# Patient Record
Sex: Female | Born: 1985 | Race: White | Hispanic: No | Smoking: Former smoker
Health system: Southern US, Community
[De-identification: ages and names within clinical notes are randomized; demographics above are authoritative.]

## PROBLEM LIST (undated history)

## (undated) ENCOUNTER — Inpatient Hospital Stay (HOSPITAL_COMMUNITY): Payer: Self-pay

## (undated) DIAGNOSIS — F988 Other specified behavioral and emotional disorders with onset usually occurring in childhood and adolescence: Secondary | ICD-10-CM

## (undated) DIAGNOSIS — E559 Vitamin D deficiency, unspecified: Secondary | ICD-10-CM

## (undated) DIAGNOSIS — E042 Nontoxic multinodular goiter: Secondary | ICD-10-CM

## (undated) DIAGNOSIS — E669 Obesity, unspecified: Secondary | ICD-10-CM

## (undated) DIAGNOSIS — D510 Vitamin B12 deficiency anemia due to intrinsic factor deficiency: Secondary | ICD-10-CM

## (undated) DIAGNOSIS — K219 Gastro-esophageal reflux disease without esophagitis: Secondary | ICD-10-CM

## (undated) HISTORY — DX: Gastro-esophageal reflux disease without esophagitis: K21.9

## (undated) HISTORY — PX: WISDOM TOOTH EXTRACTION: SHX21

## (undated) HISTORY — DX: Nontoxic multinodular goiter: E04.2

## (undated) HISTORY — DX: Vitamin B12 deficiency anemia due to intrinsic factor deficiency: D51.0

## (undated) HISTORY — DX: Vitamin D deficiency, unspecified: E55.9

## (undated) HISTORY — DX: Obesity, unspecified: E66.9

---
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
## 2013-02-16 ENCOUNTER — Inpatient Hospital Stay (HOSPITAL_COMMUNITY)
Admission: AD | Admit: 2013-02-16 | Discharge: 2013-02-17 | Disposition: A | Discharge status: Home / Self Care | Payer: Medicaid Other | Source: Ambulatory Visit | Attending: Obstetrics and Gynecology | Admitting: Obstetrics and Gynecology

## 2013-02-16 ENCOUNTER — Encounter (HOSPITAL_COMMUNITY): Payer: Self-pay | Admitting: *Deleted

## 2013-02-16 DIAGNOSIS — Z3490 Encounter for supervision of normal pregnancy, unspecified, unspecified trimester: Secondary | ICD-10-CM

## 2013-02-16 DIAGNOSIS — B9689 Other specified bacterial agents as the cause of diseases classified elsewhere: Secondary | ICD-10-CM

## 2013-02-16 DIAGNOSIS — O99891 Other specified diseases and conditions complicating pregnancy: Secondary | ICD-10-CM | POA: Insufficient documentation

## 2013-02-16 DIAGNOSIS — R109 Unspecified abdominal pain: Secondary | ICD-10-CM | POA: Insufficient documentation

## 2013-02-16 HISTORY — DX: Nontoxic multinodular goiter: E04.2

## 2013-02-16 LAB — POCT PREGNANCY, URINE: Preg Test, Ur: POSITIVE — AB

## 2013-02-16 NOTE — MAU Note (Signed)
Pt reports having lower left abd pain and cramping that started last night. Reports hav ing some white creamy vag discharge.

## 2013-02-17 ENCOUNTER — Inpatient Hospital Stay (HOSPITAL_COMMUNITY): Payer: Medicaid Other

## 2013-02-17 DIAGNOSIS — A499 Bacterial infection, unspecified: Secondary | ICD-10-CM

## 2013-02-17 DIAGNOSIS — N760 Acute vaginitis: Secondary | ICD-10-CM

## 2013-02-17 LAB — CBC
HCT: 35 % — ABNORMAL LOW (ref 36.0–46.0)
Hemoglobin: 12.1 g/dL (ref 12.0–15.0)
MCH: 29.9 pg (ref 26.0–34.0)
MCHC: 34.6 g/dL (ref 30.0–36.0)
MCV: 86.4 fL (ref 78.0–100.0)
Platelets: 276 10*3/uL (ref 150–400)
RBC: 4.05 MIL/uL (ref 3.87–5.11)
RDW: 12.6 % (ref 11.5–15.5)
WBC: 10.2 10*3/uL (ref 4.0–10.5)

## 2013-02-17 LAB — URINALYSIS, ROUTINE W REFLEX MICROSCOPIC
Bilirubin Urine: NEGATIVE
Glucose, UA: NEGATIVE mg/dL
Hgb urine dipstick: NEGATIVE
Ketones, ur: NEGATIVE mg/dL
Leukocytes, UA: NEGATIVE
Nitrite: NEGATIVE
Protein, ur: NEGATIVE mg/dL
Specific Gravity, Urine: 1.015 (ref 1.005–1.030)
Urobilinogen, UA: 0.2 mg/dL (ref 0.0–1.0)
pH: 6 (ref 5.0–8.0)

## 2013-02-17 LAB — WET PREP, GENITAL: Yeast Wet Prep HPF POC: NONE SEEN

## 2013-02-17 MED ORDER — METRONIDAZOLE 500 MG PO TABS: 500.0000 mg | ORAL_TABLET | Freq: Two times a day (BID) | ORAL | Status: DC

## 2013-02-17 NOTE — MAU Provider Note (Signed)
Attestation of Attending Supervision of Advanced Practitioner: Evaluation and management procedures were performed by the PA/NP/CNM/OB Fellow under my supervision/collaboration. Chart reviewed and agree with management and plan.  Pairlee Sawtell V 02/17/2013 1:17 PM

## 2013-02-17 NOTE — MAU Provider Note (Signed)
History     CSN: [id number]  Arrival date and time: 02/16/13 2152   First Provider Initiated Contact with Patient 02/17/13 0013      Chief Complaint  Patient presents with  . Abdominal Pain   HPI This is a 27 y.o. female at [redacted]w[redacted]d who presents with c/o lower abdominal cramping since yesterday. Denies bleeding. Has some discharge, no itching or odor. Has not been seen for this pregnancy yet. Thinks she might go to one of the [employer]. Mainly wants to deliver here, not sure which MD group.  RN Note: Pt reports having lower left abd pain and cramping that started last night. Reports hav ing some white creamy vag discharge.      OB History   Grav Para Term Preterm Abortions TAB SAB Ect Mult Living   3    2 2           Past Medical History  Diagnosis Date  . Multiple thyroid nodules     History reviewed. No pertinent past surgical history.  History reviewed. No pertinent family history.  History  Substance Use Topics  . Smoking status: Never Smoker   . Smokeless tobacco: Not on file  . Alcohol Use: No    Allergies:  Allergies  Allergen Reactions  . Ceclor (Cefaclor) Hives    Prescriptions prior to admission  Medication Sig Dispense Refill  . Prenatal Vit-Fe Fumarate-FA (PRENATAL MULTIVITAMIN) TABS Take 1 tablet by mouth daily at 12 noon.        Review of Systems  Constitutional: Negative for fever and malaise/fatigue.  Gastrointestinal: Positive for abdominal pain. Negative for nausea, vomiting, diarrhea and constipation.  Neurological: Negative for dizziness and headaches.   Physical Exam   Blood pressure 132/74, pulse 110, temperature 98.5 F (36.9 C), temperature source Oral, resp. rate 16, height 5\' 2"  (1.575 m), weight 82.555 kg (182 lb), last menstrual period 12/30/2012.  Physical Exam  Constitutional: She is oriented to person, place, and time. She appears well-developed and well-nourished. No distress.  HENT:  Head: Normocephalic.   Cardiovascular: Normal rate.   Respiratory: Effort normal.  GI: Soft. She exhibits no distension and no mass. There is no tenderness. There is no rebound and no guarding.  Genitourinary: Uterus normal. Vaginal discharge: thick white.  Uterus 6-8 week size Nontender Adnexae nontender  Musculoskeletal: Normal range of motion.  Neurological: She is alert and oriented to person, place, and time.  Skin: Skin is warm and dry.  Psychiatric: She has a normal mood and affect.    MAU Course  Procedures  MDM US Ob Transvaginal  02/17/2013   *RADIOLOGY REPORT*  Clinical Data: Left lower quadrant pain.  Quantitative beta HCG pending.  OBSTETRIC <14 WK ULTRASOUND  Technique:  Transabdominal ultrasound was performed for evaluation of the gestation as well as the maternal uterus and adnexal regions.  Comparison:  None.  Intrauterine gestational sac: Visualized/normal in shape. Yolk sac: Present Embryo: Present Cardiac Activity: Present Heart Rate: 125 bpm  CRL:  8.8 mm  7 w  0 d        [place] EDC: 10/06/2012  Maternal uterus/Adnexae: Right ovary is not visualized.  There is no subchorionic hemorrhage.  There is a normal physiologic appearance of the left ovary.  No free fluid.  IMPRESSION: Uncomplicated single intrauterine pregnancy.   Original Report Authenticated By: Andreas Newport, M.D.  Results for orders placed during the hospital encounter of 02/16/13 (from the past 24 hour(s))  URINALYSIS, ROUTINE W REFLEX MICROSCOPIC  Status: None   Collection Time    02/16/13 10:13 PM      Result Value Range   Color, Urine YELLOW  YELLOW   APPearance CLEAR  CLEAR   Specific Gravity, Urine 1.015  1.005 - 1.030   pH 6.0  5.0 - 8.0   Glucose, UA NEGATIVE  NEGATIVE mg/dL   Hgb urine dipstick NEGATIVE  NEGATIVE   Bilirubin Urine NEGATIVE  NEGATIVE   Ketones, ur NEGATIVE  NEGATIVE mg/dL   Protein, ur NEGATIVE  NEGATIVE mg/dL   Urobilinogen, UA 0.2  0.0 - 1.0 mg/dL   Nitrite NEGATIVE  NEGATIVE   Leukocytes, UA  NEGATIVE  NEGATIVE  POCT PREGNANCY, URINE     Status: Abnormal   Collection Time    02/16/13 11:08 PM      Result Value Range   Preg Test, Ur POSITIVE (*) NEGATIVE  WET PREP, GENITAL     Status: Abnormal   Collection Time    02/17/13 12:15 AM      Result Value Range   Yeast Wet Prep HPF POC NONE SEEN  NONE SEEN   Trich, Wet Prep NONE SEEN  NONE SEEN   Clue Cells Wet Prep HPF POC FEW (*) NONE SEEN   WBC, Wet Prep HPF POC FEW (*) NONE SEEN  HCG, QUANTITATIVE, PREGNANCY     Status: Abnormal   Collection Time    02/17/13 12:40 AM      Result Value Range   hCG, Beta Chain, Quant, S [id number] (*) <5 mIU/mL  CBC     Status: Abnormal   Collection Time    02/17/13 12:40 AM      Result Value Range   WBC 10.2  4.0 - 10.5 K/uL   RBC 4.05  3.87 - 5.11 MIL/uL   Hemoglobin 12.1  12.0 - 15.0 g/dL   HCT 56.2 (*) 13.0 - 86.5 %   MCV 86.4  78.0 - 100.0 fL   MCH 29.9  26.0 - 34.0 pg   MCHC 34.6  30.0 - 36.0 g/dL   RDW 78.4  69.6 - 29.5 %   Platelets 276  150 - 400 K/uL     Assessment and Plan  A:  [facility] at [redacted]w[redacted]d       Reassuring fetal ultrasound  P:  Discharge home       Come back if worsens or has bleeding       Start prenatal care  [facility] 02/17/2013, 1:23 AM

## 2013-02-18 LAB — GC/CHLAMYDIA PROBE AMP
CT Probe RNA: POSITIVE — AB
GC Probe RNA: NEGATIVE

## 2013-03-01 ENCOUNTER — Telehealth: Payer: Self-pay

## 2013-03-01 NOTE — Telephone Encounter (Signed)
Pt called and stated that she had came to ER for a miscarriage but could not understand the rest of message.   Called pt and pt stated that her issue was addressed.  Pt did not have any other questions.

## 2013-12-22 ENCOUNTER — Encounter (HOSPITAL_COMMUNITY): Payer: Self-pay | Admitting: *Deleted

## 2014-05-22 ENCOUNTER — Encounter (HOSPITAL_COMMUNITY): Payer: Self-pay | Admitting: *Deleted

## 2016-04-21 ENCOUNTER — Emergency Department (HOSPITAL_BASED_OUTPATIENT_CLINIC_OR_DEPARTMENT_OTHER)
Admission: EM | Admit: 2016-04-21 | Discharge: 2016-04-21 | Disposition: A | Discharge status: Home / Self Care | Payer: BLUE CROSS/BLUE SHIELD | Attending: Emergency Medicine | Admitting: Emergency Medicine

## 2016-04-21 ENCOUNTER — Encounter (HOSPITAL_BASED_OUTPATIENT_CLINIC_OR_DEPARTMENT_OTHER): Payer: Self-pay

## 2016-04-21 DIAGNOSIS — M542 Cervicalgia: Secondary | ICD-10-CM

## 2016-04-21 DIAGNOSIS — F17200 Nicotine dependence, unspecified, uncomplicated: Secondary | ICD-10-CM | POA: Insufficient documentation

## 2016-04-21 DIAGNOSIS — F909 Attention-deficit hyperactivity disorder, unspecified type: Secondary | ICD-10-CM | POA: Insufficient documentation

## 2016-04-21 DIAGNOSIS — R590 Localized enlarged lymph nodes: Secondary | ICD-10-CM | POA: Diagnosis not present

## 2016-04-21 HISTORY — DX: Other specified behavioral and emotional disorders with onset usually occurring in childhood and adolescence: F98.8

## 2016-04-21 MED ORDER — IBUPROFEN 800 MG PO TABS: 800.0000 mg | ORAL_TABLET | Freq: Three times a day (TID) | ORAL | 0 refills | Status: AC

## 2016-04-21 NOTE — ED Triage Notes (Signed)
Dx with thyroid nodules in 2011-worse since July 2017-today area is more painful to right side of neck-NAD-steady gait

## 2016-04-21 NOTE — Discharge Instructions (Signed)
Please follow up with your primary care provider for further evaluation of your neck pain and your thyroid nodule.  Take ibuprofen as needed for pain.  Return to the ER if you have difficulty swallowing, breathing, having fever, worsening chest pain or if you have other concerns.

## 2016-04-21 NOTE — ED Provider Notes (Signed)
[facility] Provider Note   CSN: [id number] Arrival date & time: 04/21/16  1938  By signing my name below, I, Phillis Haggis, attest that this documentation has been prepared under the direction and in the presence of Fayrene Helper, PA-C. Electronically Signed: Phillis Haggis, ED Scribe. 04/21/16. 10:27 PM.  History   Chief Complaint Chief Complaint  Patient presents with  . Thyroid Nodule   The history is provided by the patient. No language interpreter was used.   HPI Comments: Vanessa Campbell is a 30 y.o. Female with a hx of multiple thyroid nodules who presents to the Emergency Department complaining of worsening, recurrent thyroid nodules onset 3 months ago, worsening today. Pt reports worsening, sharp pain to the right side of her neck that radiates to her chest and right ear. There is associated swelling. This pain is worsened with rotation of the neck, palpation, and swallowing. She notes increased heart palpitations and intermittent headache. Pt is being followed by her PCP for the three nodules present. She says that she had a biopsy of a concerning nodule in July that was benign. Pt has an ultrasound performed yearly for the nodules. She denies fever, chills, diaphoresis, sneezing, coughing, trouble swallowing, voice change, SOB, chest pain, abdominal pain, nausea, vomiting, heat intolerance that is new, or speech difficulty. Pt would like to be referred to a specialist for further evaluation. Pt's PCP is Dr. Juanetta Gosling in [place].   Past Medical History:  Diagnosis Date  . ADD (attention deficit disorder)   . Multiple thyroid nodules    There are no active problems to display for this patient.  Past Surgical History:  Procedure Laterality Date  . WISDOM TOOTH EXTRACTION     OB History    Gravida Para Term Preterm AB Living   3       2     SAB TAB Ectopic Multiple Live Births     2           Home Medications    Prior to Admission medications   Medication  Sig Start Date End Date Taking? Authorizing Provider  ALPRAZolam Prudy Feeler) 0.25 MG tablet Take 0.25 mg by mouth at bedtime as needed for anxiety.   Yes Historical Provider, MD  lisdexamfetamine (VYVANSE) 40 MG capsule Take 40 mg by mouth every morning.   Yes Historical Provider, MD    Family History No family history on file.  Social History Social History  Substance Use Topics  . Smoking status: Current Every Day Smoker  . Smokeless tobacco: Never Used  . Alcohol use Yes     Comment: occ     Allergies   Ceclor [cefaclor]  Review of Systems Review of Systems  Constitutional: Negative for chills and fever.  HENT: Negative for sneezing, trouble swallowing and voice change.   Respiratory: Negative for cough and shortness of breath.   Cardiovascular: Positive for palpitations. Negative for chest pain.  Gastrointestinal: Negative for abdominal pain, nausea and vomiting.  Endocrine: Negative for heat intolerance.  Musculoskeletal: Positive for neck pain.  Neurological: Positive for headaches. Negative for speech difficulty.  All other systems reviewed and are negative.  Physical Exam Updated Vital Signs BP 133/100 (BP Location: Right Arm)   Pulse 92   Temp 98.8 F (37.1 C) (Oral)   Resp 20   Ht 5\' 2"  (1.575 m)   Wt 174 lb (78.9 kg)   LMP 04/16/2016   SpO2 100%   BMI 31.83 kg/m   Physical Exam  Constitutional: She  is oriented to person, place, and time. She appears well-developed and well-nourished.  HENT:  Head: Normocephalic and atraumatic.  Eyes: EOM are normal. Pupils are equal, round, and reactive to light.  Neck: Trachea normal and normal range of motion. Neck supple. No tracheal deviation present. No thyromegaly present.  Tenderness to palpation noted to the right sternocleidomastoid muscle without any surrounding skin changes; trachea midline; there is a reactive lymph node to the right supraclavicular region that is tender to palpation  Cardiovascular: Normal  rate, regular rhythm and normal heart sounds.  Exam reveals no gallop and no friction rub.   No murmur heard. Pulmonary/Chest: Effort normal and breath sounds normal. She has no wheezes.  Abdominal: Soft. There is no tenderness.  Musculoskeletal: Normal range of motion.  Lymphadenopathy:    She has no cervical adenopathy.  Neurological: She is alert and oriented to person, place, and time.  Skin: Skin is warm and dry.  Psychiatric: She has a normal mood and affect. Her behavior is normal.  Nursing note and vitals reviewed.  ED Treatments / Results  DIAGNOSTIC STUDIES: Oxygen Saturation is 99% on RA, normal by my interpretation.    COORDINATION OF CARE: 10:25 PM-Discussed treatment plan which includes follow up with PCP with pt at bedside and pt agreed to plan.    Labs (all labs ordered are listed, but only abnormal results are displayed) Labs Reviewed - No data to display  EKG  EKG Interpretation None       Radiology No results found.  Procedures Procedures (including critical care time)  Medications Ordered in ED Medications - No data to display   Initial Impression / Assessment and Plan / ED Course  I have reviewed the triage vital signs and the nursing notes.  Pertinent labs & imaging results that were available during my care of the patient were reviewed by me and considered in my medical decision making (see chart for details).  Clinical Course    BP 133/100 (BP Location: Right Arm)   Pulse 92   Temp 98.8 F (37.1 C) (Oral)   Resp 20   Ht 5\' 2"  (1.575 m)   Wt 78.9 kg   LMP 04/16/2016   SpO2 100%   BMI 31.83 kg/m    Final Clinical Impressions(s) / ED Diagnoses   Final diagnoses:  Supraclavicular lymphadenopathy  Neck pain on right side   I personally performed the services described in this documentation, which was scribed in my presence. The recorded information has been reviewed and is accurate.     New Prescriptions New Prescriptions    IBUPROFEN (ADVIL,MOTRIN) 800 MG TABLET    Take 1 tablet (800 mg total) by mouth 3 (three) times daily.   10:48 PM Pt here with R sided neck pain.  She does have tenderness to her SCM and also have a reactive lymph node to the R supraclavicular region.  Thyroid appears normal.  No surrounding skin changes to suggest cellulitis.  She has no airway compromise.  No significant risk factor for breast CA.  VSS.  At this time, I recommend watchful waiting, to take NSAIDs as needed for pain and to f/u with her PCP for further care.  Pt encourage to return to the ER if she develops fever, difficulty breathing, chest pain, or worsening of her condition.  Pt stable for discharge. Doubt thyrotoxicosis, thyroid storm, myxedema coma or other acute emergent condition.     Fayrene HelperBowie Pietro Bonura, PA-C 04/21/16 [id number]    Rolan BuccoMelanie Belfi, MD 04/21/16  2318  

## 2016-04-21 NOTE — ED Notes (Signed)
Urine sample taken to lab. No orders at this time.

## 2016-04-24 ENCOUNTER — Emergency Department (HOSPITAL_COMMUNITY): Payer: BLUE CROSS/BLUE SHIELD

## 2016-04-24 ENCOUNTER — Emergency Department (HOSPITAL_COMMUNITY)
Admission: EM | Admit: 2016-04-24 | Discharge: 2016-04-24 | Disposition: A | Discharge status: Home / Self Care | Payer: BLUE CROSS/BLUE SHIELD | Attending: Emergency Medicine | Admitting: Emergency Medicine

## 2016-04-24 DIAGNOSIS — F17200 Nicotine dependence, unspecified, uncomplicated: Secondary | ICD-10-CM | POA: Diagnosis not present

## 2016-04-24 DIAGNOSIS — M542 Cervicalgia: Secondary | ICD-10-CM

## 2016-04-24 DIAGNOSIS — E041 Nontoxic single thyroid nodule: Secondary | ICD-10-CM | POA: Diagnosis not present

## 2016-04-24 DIAGNOSIS — R079 Chest pain, unspecified: Secondary | ICD-10-CM | POA: Diagnosis present

## 2016-04-24 DIAGNOSIS — F909 Attention-deficit hyperactivity disorder, unspecified type: Secondary | ICD-10-CM | POA: Insufficient documentation

## 2016-04-24 LAB — COMPREHENSIVE METABOLIC PANEL
ALT: 21 U/L (ref 14–54)
AST: 19 U/L (ref 15–41)
Albumin: 4.7 g/dL (ref 3.5–5.0)
Alkaline Phosphatase: 61 U/L (ref 38–126)
Anion gap: 9 (ref 5–15)
BUN: 6 mg/dL (ref 6–20)
CO2: 27 mmol/L (ref 22–32)
Calcium: 9.9 mg/dL (ref 8.9–10.3)
Chloride: 103 mmol/L (ref 101–111)
Creatinine, Ser: 0.68 mg/dL (ref 0.44–1.00)
GFR calc Af Amer: >60 mL/min (ref >60)
GFR calc non Af Amer: >60 mL/min (ref >60)
Glucose, Bld: 92 mg/dL (ref 65–99)
Potassium: 3.8 mmol/L (ref 3.5–5.1)
Sodium: 139 mmol/L (ref 135–145)
Total Bilirubin: 0.2 mg/dL — ABNORMAL LOW (ref 0.3–1.2)
Total Protein: 7.3 g/dL (ref 6.5–8.1)

## 2016-04-24 LAB — CBC WITH DIFFERENTIAL/PLATELET
Basophils Absolute: 0 10*3/uL (ref 0.0–0.1)
Basophils Relative: 0 %
Eosinophils Absolute: 0 10*3/uL (ref 0.0–0.7)
Eosinophils Relative: 0 %
HCT: 41.8 % (ref 36.0–46.0)
Hemoglobin: 14.2 g/dL (ref 12.0–15.0)
Lymphocytes Relative: 29 %
Lymphs Abs: 2.3 10*3/uL (ref 0.7–4.0)
MCH: 30.7 pg (ref 26.0–34.0)
MCHC: 34 g/dL (ref 30.0–36.0)
MCV: 90.3 fL (ref 78.0–100.0)
Monocytes Absolute: 0.3 10*3/uL (ref 0.1–1.0)
Monocytes Relative: 4 %
Neutro Abs: 5.1 10*3/uL (ref 1.7–7.7)
Neutrophils Relative %: 67 %
Platelets: 334 10*3/uL (ref 150–400)
RBC: 4.63 MIL/uL (ref 3.87–5.11)
RDW: 11.9 % (ref 11.5–15.5)
WBC: 7.8 10*3/uL (ref 4.0–10.5)

## 2016-04-24 LAB — I-STAT BETA HCG BLOOD, ED (MC, WL, AP ONLY)

## 2016-04-24 LAB — I-STAT TROPONIN, ED: Troponin i, poc: 0 ng/mL (ref 0.00–0.08)

## 2016-04-24 MED ORDER — ORPHENADRINE CITRATE ER 100 MG PO TB12: 100.0000 mg | ORAL_TABLET | Freq: Two times a day (BID) | ORAL | 0 refills | Status: DC

## 2016-04-24 MED ORDER — IOPAMIDOL (ISOVUE-300) INJECTION 61%
INTRAVENOUS | Status: AC
  Administered 2016-04-24: 75 mL
  Filled 2016-04-24: qty 75

## 2016-04-24 NOTE — ED Triage Notes (Addendum)
Pt reports centralized chest pain since yesterday that radiates to right neck, right shoulder and into right ear. Pt also reports also a sharp pain in the back of her throat. Pt reports she does not think she got anything stuck in her throat. Pt has hx of nodules on her thyroid. Pt has had the throat and neck pain since Monday.

## 2016-04-24 NOTE — ED Provider Notes (Signed)
[facility]PT Provider Note   CSN: [id number] Arrival date & time: 04/24/16  1636     History   Chief Complaint Chief Complaint  Patient presents with  . Chest Pain  . Cough  . Thyroid Problem    HPI Vanessa Campbell is a 30 y.o. female.  HPI Patient reports that she has a tender area on the right side of her neck. She reports that the swelling has increased. She reports that now she is having pain which she perceives deep and toward the back of her throat. She reports sometimes the pain is sharp and radiates both up towards her ear as well as down towards her collarbone. She is also perceiving some aching at the base of her neck anteriorly. He has not developed fever or chills. She does not have nausea or vomiting. Patient was seen 3 days ago but reports his symptoms are worsening. Past Medical History:  Diagnosis Date  . ADD (attention deficit disorder)   . Multiple thyroid nodules     There are no active problems to display for this patient.   Past Surgical History:  Procedure Laterality Date  . WISDOM TOOTH EXTRACTION      OB History    Gravida Para Term Preterm AB Living   3       2     SAB TAB Ectopic Multiple Live Births     2             Home Medications    Prior to Admission medications   Medication Sig Start Date End Date Taking? Authorizing Provider  ALPRAZolam Prudy Feeler) 0.25 MG tablet Take 0.25 mg by mouth at bedtime as needed for anxiety.   Yes Historical Provider, MD  ibuprofen (ADVIL,MOTRIN) 800 MG tablet Take 1 tablet (800 mg total) by mouth 3 (three) times daily. 04/21/16  Yes Fayrene Helper, PA-C  lisdexamfetamine (VYVANSE) 40 MG capsule Take 40 mg by mouth every morning.   Yes Historical Provider, MD  orphenadrine (NORFLEX) 100 MG tablet Take 1 tablet (100 mg total) by mouth 2 (two) times daily. 04/24/16   Arby Barrette, MD    Family History No family history on file.  Social History Social History  Substance Use Topics  . Smoking status:  Current Every Day Smoker  . Smokeless tobacco: Never Used  . Alcohol use Yes     Comment: occ     Allergies   Ceclor [cefaclor]   Review of Systems Review of Systems 10 Systems reviewed and are negative for acute change except as noted in the HPI.   Physical Exam Updated Vital Signs BP 124/87 (BP Location: Right Arm)   Pulse 80   Resp 12   LMP 04/16/2016   SpO2 99%   Physical Exam  Constitutional: She is oriented to person, place, and time. She appears well-developed and well-nourished. No distress.  HENT:  Head: Normocephalic and atraumatic.  Right Ear: External ear normal.  Left Ear: External ear normal.  Nose: Nose normal.  Mouth/Throat: Oropharynx is clear and moist.  Bilateral TMs normal. No facial swelling or asymmetry. Dentition is in excellent condition. Posterior oropharynx is widely patent. No tonsillar erythema, swelling or exudate. Visualization underneath the tongue shows mucous membranes to be in normal condition without swellings or masses. Palpation of the neck shows the patient to be tender in the right lateral neck in the area of the anterior margin of the SCM and at the base of the SCM along the top of collarbone. Possible  mild swelling in this area relative to the left. Cannot isolate distinct mass. Patient does not have meningismus. No stridor. No skin changes.  Eyes: Conjunctivae and EOM are normal. Pupils are equal, round, and reactive to light.  Neck: Neck supple. No JVD present. No tracheal deviation present. No thyromegaly present.  Cardiovascular: Normal rate and regular rhythm.   No murmur heard. Pulmonary/Chest: Effort normal and breath sounds normal. No stridor. No respiratory distress.  Abdominal: Soft. There is no tenderness.  Musculoskeletal: She exhibits no edema, tenderness or deformity.  Lymphadenopathy:    She has no cervical adenopathy.  Neurological: She is alert and oriented to person, place, and time. No cranial nerve deficit. She  exhibits normal muscle tone. Coordination normal.  Skin: Skin is warm and dry. No pallor.  Psychiatric: She has a normal mood and affect.  Nursing note and vitals reviewed.    ED Treatments / Results  Labs (all labs ordered are listed, but only abnormal results are displayed) Labs Reviewed  COMPREHENSIVE METABOLIC PANEL - Abnormal; Notable for the following:       Result Value   Total Bilirubin 0.2 (*)    All other components within normal limits  CBC WITH DIFFERENTIAL/PLATELET  I-STAT TROPOININ, ED  I-STAT BETA HCG BLOOD, ED ([facility], [facility], [facility] ONLY)    EKG  EKG Interpretation  Date/Time:  Thursday April 24 2016 16:47:10 EDT Ventricular Rate:  92 PR Interval:  156 QRS Duration: 82 QT Interval:  342 QTC Calculation: 422 R Axis:   59 Text Interpretation:  Normal sinus rhythm Normal ECG agree. no old comparison. Confirmed by Donnald Garre, MD, Lebron Conners [phone]) on 04/24/2016 5:20:26 PM Also confirmed by Donnald Garre, MD, Lebron Conners [phone]), editor [place]  CCT, [place] ([id number])  on 04/25/2016 8:48:56 AM       Radiology Dg Chest 2 View  Result Date: 04/24/2016 CLINICAL DATA:  Chest pain, pain in throat with burning, shortness of breath and dry cough, onset of symptoms yesterday, history smoking EXAM: CHEST  2 VIEW COMPARISON:  None FINDINGS: Normal heart size, mediastinal contours, and pulmonary vascularity. Lungs clear. No pleural effusion or pneumothorax. Bones unremarkable. IMPRESSION: Normal exam. Electronically Signed   By: Ulyses Southward M.D.   On: 04/24/2016 17:18   Ct Soft Tissue Neck W Contrast  Result Date: 04/24/2016 CLINICAL DATA:  30 year old female with right neck pain and swelling. Query abscess or mass. Initial encounter. Personal history of thyroid nodules EXAM: CT NECK WITH CONTRAST TECHNIQUE: Multidetector CT imaging of the neck was performed using the standard protocol following the bolus administration of intravenous contrast. CONTRAST:  75mL ISOVUE-300 IOPAMIDOL (ISOVUE-300)  INJECTION 61% COMPARISON:  None. FINDINGS: Pharynx and larynx: Negative larynx. Pharynx soft tissue contours are within normal limits. Negative parapharyngeal and retropharyngeal spaces. Salivary glands: Negative sublingual space. Negative submandibular glands. Negative parotid glands. Thyroid: Partially cystic partially solid 17 mm right thyroid nodule. Otherwise negative. Lymph nodes: No cervical lymphadenopathy. The largest nodes are at the bilateral level 2 stations measuring up to 8-9 mm short axis, within normal limits. No calcified, cystic or necrotic nodes. No areas of inflammatory stranding identified in the neck. Vascular: Major vascular structures in the neck and at the skullbase are patent, including the right IJ. Limited intracranial: Negative. Visualized orbits: Not included. Mastoids and visualized paranasal sinuses: Visible paranasal sinuses and mastoids are clear. Skeleton: No osseous abnormality identified. No definite dental abnormality. Upper chest: Normal visualized superior mediastinum, small residual thymus. Negative lung apices; minimal subpleural scarring or atelectasis on the right.  No superior mediastinal or visible axillary lymphadenopathy. Other: None. IMPRESSION: Negative neck CT aside from a 17 mm right thyroid nodule which meets consensus criteria for characterization with non emergent Thyroid Ultrasound if not previously performed. No acute or inflammatory process identified. Electronically Signed   By: Odessa FlemingH  Hall M.D.   On: 04/24/2016 19:26    Procedures Procedures (including critical care time)  Medications Ordered in ED Medications  iopamidol (ISOVUE-300) 61 % injection (75 mLs  Contrast Given 04/24/16 1859)     Initial Impression / Assessment and Plan / ED Course  I have reviewed the triage vital signs and the nursing notes.  Pertinent labs & imaging results that were available during my care of the patient were reviewed by me and considered in my medical decision  making (see chart for details).  Clinical Course    Final Clinical Impressions(s) / ED Diagnoses   Final diagnoses:  Nonspecific pain in the neck region  Thyroid nodule   Patient is alert and nontoxic. Vital signs are stable. Airway is widely patent. There is no stridor or external evidence of meningismus or trismus. CT shows no vascular, mass or likely infectious etiology. At this time until patient stable for ongoing outpatient management with follow-up with her PCP as well as ENT for further assessment. Consideration of this time is for musculoskeletal pain along the course of the SCM. Patient is advised to continue her ibuprofen as prescribed from prior visit and initiate Norflex. Also she is advised to take Pepcid twice daily for possible reflux as etiology until she can complete outpatient evaluation.  New Prescriptions Discharge Medication List as of 04/24/2016  7:53 PM    START taking these medications   Details  orphenadrine (NORFLEX) 100 MG tablet Take 1 tablet (100 mg total) by mouth 2 (two) times daily., Starting Thu 04/24/2016, Print         Arby BarretteMarcy Jadalee Westcott, MD 04/25/16 [phone]

## 2016-04-24 NOTE — ED Notes (Signed)
Patient went to xray 

## 2016-05-08 ENCOUNTER — Ambulatory Visit (INDEPENDENT_AMBULATORY_CARE_PROVIDER_SITE_OTHER): Payer: BLUE CROSS/BLUE SHIELD | Admitting: Endocrinology

## 2016-05-08 ENCOUNTER — Encounter: Payer: Self-pay | Admitting: Endocrinology

## 2016-05-08 DIAGNOSIS — F988 Other specified behavioral and emotional disorders with onset usually occurring in childhood and adolescence: Secondary | ICD-10-CM | POA: Diagnosis not present

## 2016-05-08 DIAGNOSIS — E042 Nontoxic multinodular goiter: Secondary | ICD-10-CM | POA: Diagnosis not present

## 2016-05-08 NOTE — Progress Notes (Signed)
Subjective:    Patient ID: Vanessa Campbell, female    DOB: 10/10/1985, 30 y.o.   MRN: [id number]  HPI In 2009, pt was noted to have slight swelling at the right anterior neck, but no assoc pain.  She had bx then, and has been followed with serial [place] since then.  she has no h/o XRT or surgery to the neck.   Past Medical History:  Diagnosis Date  . ADD (attention deficit disorder)   . Multinodular goiter   . Multiple thyroid nodules     Past Surgical History:  Procedure Laterality Date  . WISDOM TOOTH EXTRACTION      Social History   Social History  . Marital status: Divorced    Spouse name: N/A  . Number of children: N/A  . Years of education: N/A   Occupational History  . Not on file.   Social History Main Topics  . Smoking status: Current Every Day Smoker  . Smokeless tobacco: Never Used  . Alcohol use Yes     Comment: occ  . Drug use: No  . Sexual activity: Yes    Birth control/ protection: Implant   Other Topics Concern  . Not on file   Social History Narrative  . No narrative on file    Current Outpatient Prescriptions on File Prior to Visit  Medication Sig Dispense Refill  . ALPRAZolam (XANAX) 0.25 MG tablet Take 0.25 mg by mouth at bedtime as needed for anxiety.    Marland Kitchen ibuprofen (ADVIL,MOTRIN) 800 MG tablet Take 1 tablet (800 mg total) by mouth 3 (three) times daily. 21 tablet 0  . lisdexamfetamine (VYVANSE) 40 MG capsule Take 40 mg by mouth every morning.    . orphenadrine (NORFLEX) 100 MG tablet Take 1 tablet (100 mg total) by mouth 2 (two) times daily. 30 tablet 0   No current facility-administered medications on file prior to visit.     Allergies  Allergen Reactions  . Ceclor [Cefaclor] Hives    Family History  Problem Relation Age of Onset  . Thyroid disease Neg Hx     BP 110/80 (BP Location: Right Arm, Patient Position: Sitting, Cuff Size: Normal)   Pulse 69   Temp (!) 95.8 F (35.4 C) (Oral)   Ht 5\' 2"  (1.575 m)   Wt 180 lb (81.6 kg)    LMP 04/16/2016   SpO2 98%   BMI 32.92 kg/m   Review of Systems Denies hoarseness, neck pain, diplopia, chest pain, sob, cough, dysphagia, skin rash, depression, cold intolerance, numbness, and rhinorrhea.  She has weight gain, intermittent headache, and easy bruising.     Objective:   Physical Exam VS: see vs page GEN: no distress HEAD: head: no deformity eyes: no periorbital swelling, no proptosis external nose and ears are normal mouth: no lesion seen NECK: slight swelling at the right thyroid lobe area, but I can't tell details CHEST WALL: no deformity LUNGS: clear to auscultation CV: reg rate and rhythm, no murmur ABD: abdomen is soft, nontender.  no hepatosplenomegaly.  not distended.  no hernia MUSCULOSKELETAL: muscle bulk and strength are grossly normal.  no obvious joint swelling.  gait is normal and steady EXTEMITIES: no deformity.  no ulcer on the feet.  feet are of normal color and temp.  no edema PULSES: dorsalis pedis intact bilat.  no carotid bruit NEURO:  cn 2-12 grossly intact.   readily moves all 4's.  sensation is intact to touch on the feet SKIN:  Normal texture and temperature.  No rash or suspicious lesion is visible.   NODES:  None palpable at the neck PSYCH: alert, well-oriented.  Does not appear anxious nor depressed.    outside test results are reviewed: TSH=1.06 TPO and TG antibodies are both neg  US (2017) slight enlargement of largest nodule since 2016, with central Ca++  nuc med scan (2012): uptake at the right inf thyroid field  Neck CT: several right thyroid nodules.    I have reviewed outside records, and summarized: Pt was recently seen for anxiety and other symptoms. W/u was neg    Assessment & Plan:  Multinodular goiter, with persistent growth and worrisome Ca++ Weight gain, and other sxs, not thyroid-related.  Pt signs release of info for bx cytol reports from [facility].  Patient is advised the following: Patient  Instructions  Please see a surgery specialist.  you will receive a phone call, about a day and time for an appointment. Please come back here 1-2 weeks after you get out of the hospital.

## 2016-05-08 NOTE — Progress Notes (Signed)
Pre visit review using our clinic review tool, if applicable. No additional management support is needed unless otherwise documented below in the visit note. 

## 2016-05-08 NOTE — Patient Instructions (Signed)
Please see a surgery specialist.  you will receive a phone call, about a day and time for an appointment. Please come back here 1-2 weeks after you get out of the hospital.

## 2016-05-09 ENCOUNTER — Encounter: Payer: Self-pay | Admitting: Endocrinology

## 2016-05-09 DIAGNOSIS — F988 Other specified behavioral and emotional disorders with onset usually occurring in childhood and adolescence: Secondary | ICD-10-CM | POA: Insufficient documentation

## 2016-05-22 ENCOUNTER — Other Ambulatory Visit: Payer: Self-pay | Admitting: Surgery

## 2016-05-22 DIAGNOSIS — E041 Nontoxic single thyroid nodule: Secondary | ICD-10-CM

## 2016-06-02 ENCOUNTER — Other Ambulatory Visit: Payer: BLUE CROSS/BLUE SHIELD

## 2016-06-03 ENCOUNTER — Ambulatory Visit
Admission: RE | Admit: 2016-06-03 | Discharge: 2016-06-03 | Disposition: A | Discharge status: Home / Self Care | Payer: BLUE CROSS/BLUE SHIELD | Source: Ambulatory Visit | Attending: Surgery | Admitting: Surgery

## 2016-06-03 DIAGNOSIS — E041 Nontoxic single thyroid nodule: Secondary | ICD-10-CM

## 2017-05-08 ENCOUNTER — Other Ambulatory Visit: Payer: Self-pay | Admitting: Surgery

## 2017-05-08 DIAGNOSIS — E041 Nontoxic single thyroid nodule: Secondary | ICD-10-CM

## 2017-05-21 ENCOUNTER — Ambulatory Visit
Admission: RE | Admit: 2017-05-21 | Discharge: 2017-05-21 | Disposition: A | Discharge status: Home / Self Care | Payer: BLUE CROSS/BLUE SHIELD | Source: Ambulatory Visit | Attending: Surgery | Admitting: Surgery

## 2017-05-21 ENCOUNTER — Other Ambulatory Visit (HOSPITAL_COMMUNITY)
Admission: RE | Admit: 2017-05-21 | Discharge: 2017-05-21 | Disposition: A | Discharge status: Home / Self Care | Payer: BLUE CROSS/BLUE SHIELD | Source: Ambulatory Visit | Attending: Radiology | Admitting: Radiology

## 2017-05-21 DIAGNOSIS — E041 Nontoxic single thyroid nodule: Secondary | ICD-10-CM

## 2018-05-14 ENCOUNTER — Other Ambulatory Visit (HOSPITAL_COMMUNITY): Payer: Self-pay | Admitting: Surgery

## 2018-05-14 DIAGNOSIS — E041 Nontoxic single thyroid nodule: Secondary | ICD-10-CM

## 2018-07-24 IMAGING — CR DG CHEST 2V
2 series · 2 of 2 positions shown · non-contrast
Comparison: None

CLINICAL DATA: Chest pain, pain in throat with burning, shortness
of breath and dry cough, onset of symptoms yesterday, history
smoking

EXAM:
CHEST  2 VIEW

[chest pa]
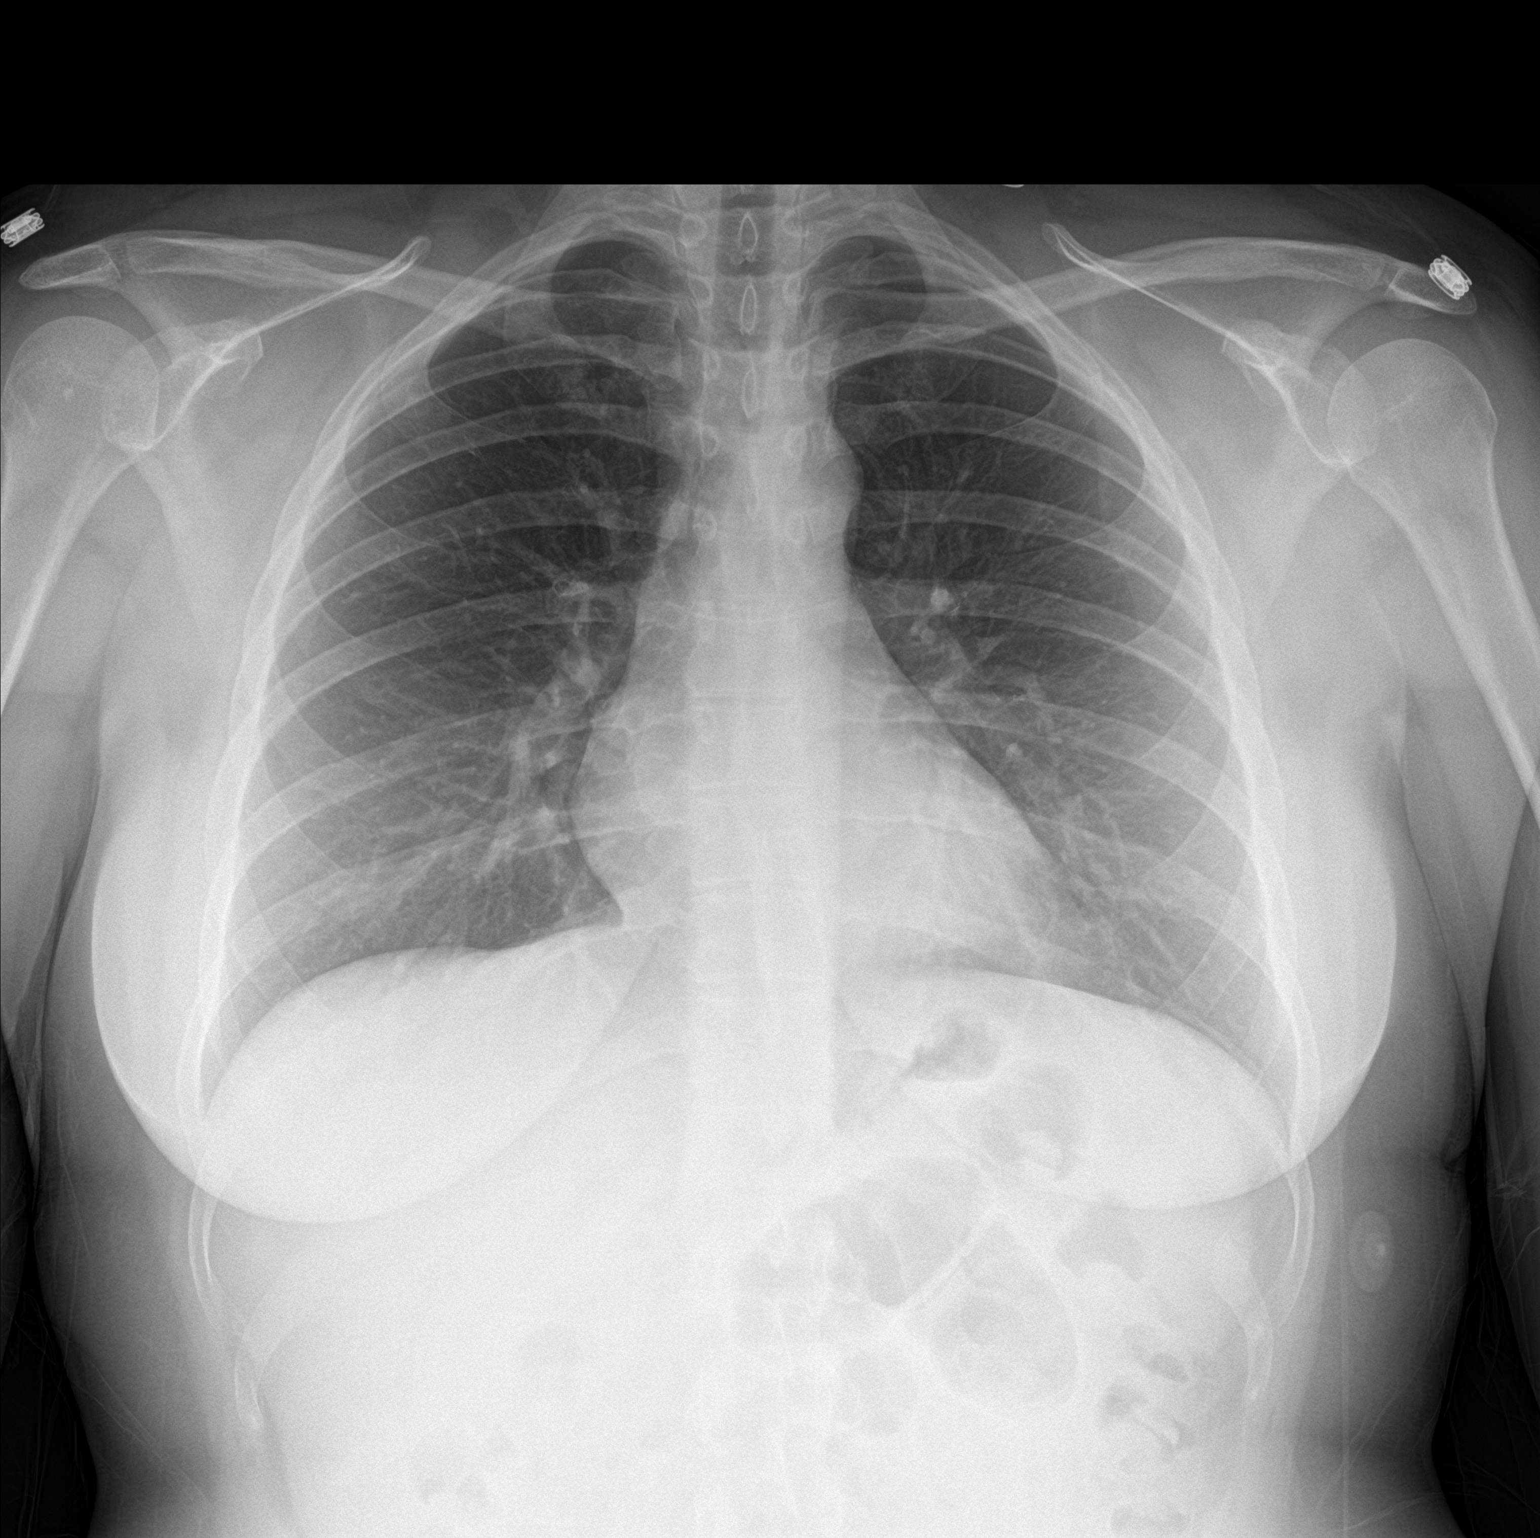

[chest lat]
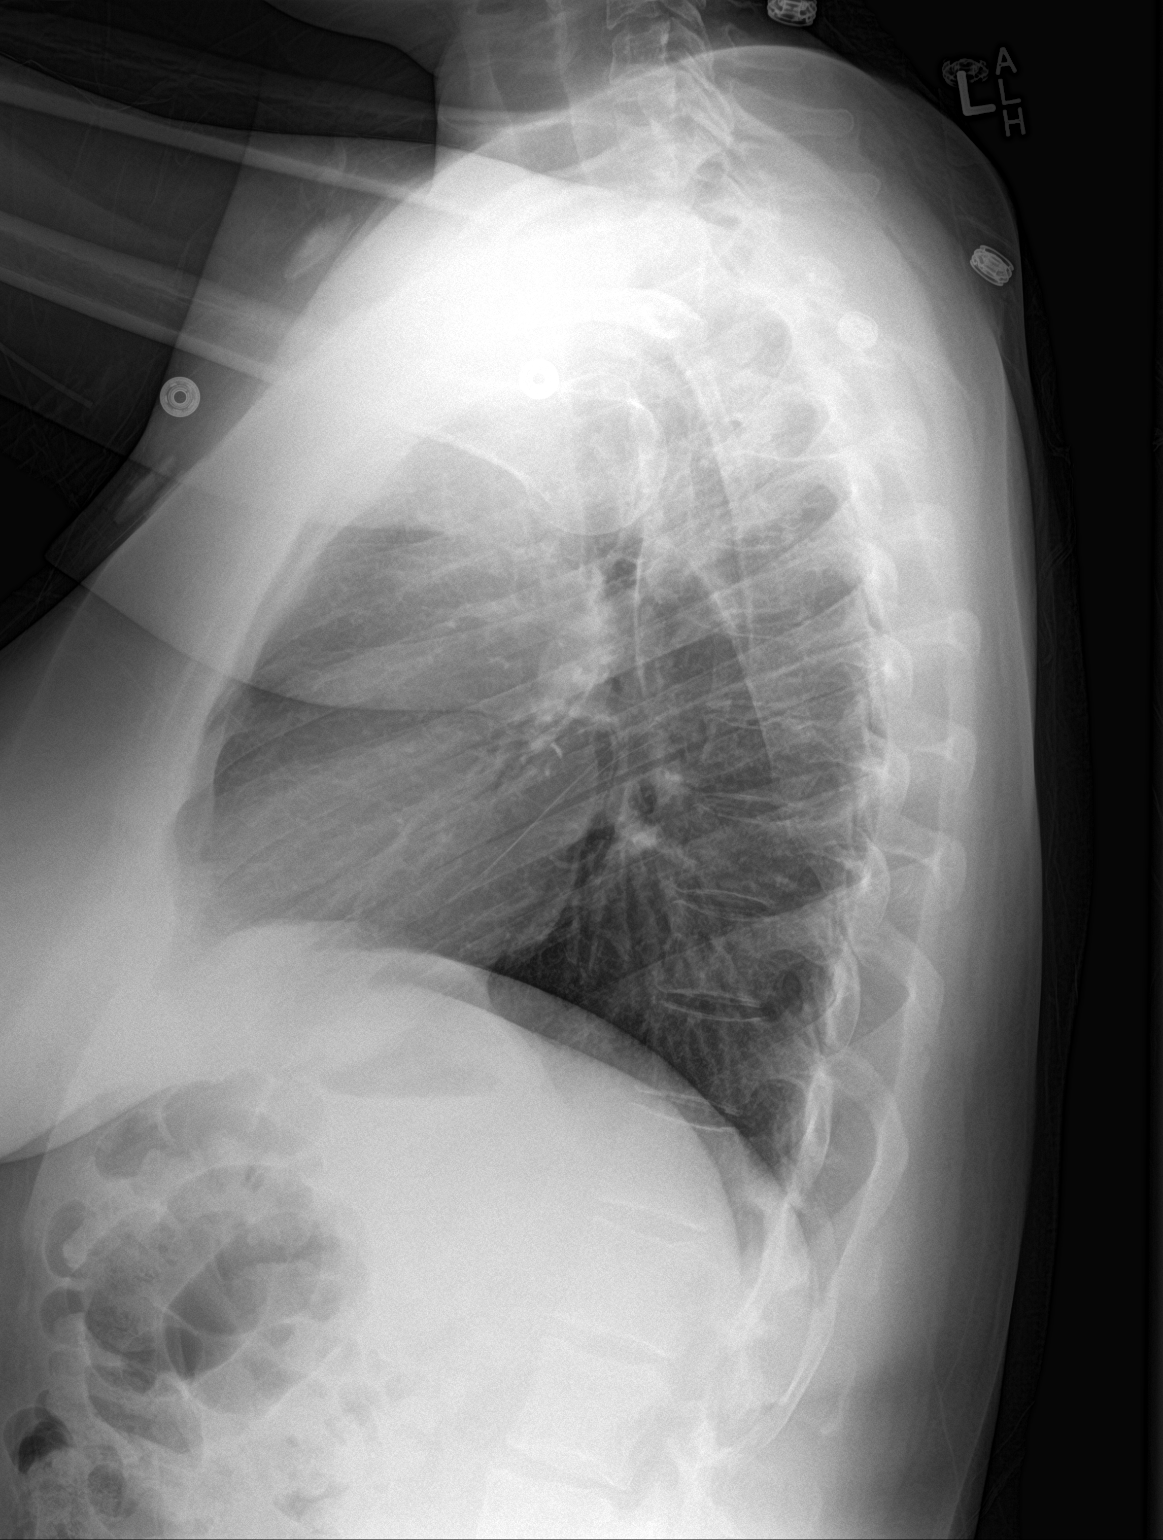

[2 of 2 positions shown; findings below may reference images not displayed]

FINDINGS: Normal heart size, mediastinal contours, and pulmonary vascularity.

Lungs clear.

No pleural effusion or pneumothorax.

Bones unremarkable.
IMPRESSION: Normal exam.

## 2018-08-27 ENCOUNTER — Encounter: Payer: Self-pay | Admitting: Gastroenterology

## 2018-09-14 ENCOUNTER — Encounter: Payer: Self-pay | Admitting: Gastroenterology

## 2018-09-14 ENCOUNTER — Other Ambulatory Visit (INDEPENDENT_AMBULATORY_CARE_PROVIDER_SITE_OTHER): Payer: Self-pay

## 2018-09-14 ENCOUNTER — Ambulatory Visit: Payer: BLUE CROSS/BLUE SHIELD | Admitting: Gastroenterology

## 2018-09-14 ENCOUNTER — Encounter (INDEPENDENT_AMBULATORY_CARE_PROVIDER_SITE_OTHER): Payer: Self-pay

## 2018-09-14 VITALS — BP 112/80 | HR 82 | Ht 62.0 in | Wt 161.8 lb

## 2018-09-14 DIAGNOSIS — R112 Nausea with vomiting, unspecified: Secondary | ICD-10-CM

## 2018-09-14 DIAGNOSIS — R1013 Epigastric pain: Secondary | ICD-10-CM

## 2018-09-14 LAB — COMPREHENSIVE METABOLIC PANEL
ALK PHOS: 50 U/L (ref 39–117)
ALT: 20 U/L (ref 0–35)
AST: 19 U/L (ref 0–37)
Albumin: 4.3 g/dL (ref 3.5–5.2)
BUN: 9 mg/dL (ref 6–23)
CO2: 28 meq/L (ref 19–32)
Calcium: 9.4 mg/dL (ref 8.4–10.5)
Chloride: 100 mEq/L (ref 96–112)
Creatinine, Ser: 0.66 mg/dL (ref 0.40–1.20)
GFR: 104.66 mL/min (ref >60.00)
GLUCOSE: 82 mg/dL (ref 70–99)
POTASSIUM: 4.4 meq/L (ref 3.5–5.1)
SODIUM: 137 meq/L (ref 135–145)
TOTAL PROTEIN: 6.8 g/dL (ref 6.0–8.3)
Total Bilirubin: 0.4 mg/dL (ref 0.2–1.2)

## 2018-09-14 LAB — SEDIMENTATION RATE: Sed Rate: 6 mm/hr (ref 0–20)

## 2018-09-14 LAB — CBC WITH DIFFERENTIAL/PLATELET
BASOS ABS: 0 10*3/uL (ref 0.0–0.1)
Basophils Relative: 0.6 % (ref 0.0–3.0)
Eosinophils Absolute: 0.1 10*3/uL (ref 0.0–0.7)
Eosinophils Relative: 0.8 % (ref 0.0–5.0)
HCT: 41.6 % (ref 36.0–46.0)
Hemoglobin: 14 g/dL (ref 12.0–15.0)
Lymphocytes Relative: 21.3 % (ref 12.0–46.0)
Lymphs Abs: 1.5 10*3/uL (ref 0.7–4.0)
MCHC: 33.6 g/dL (ref 30.0–36.0)
MCV: 92.2 fl (ref 78.0–100.0)
Monocytes Absolute: 0.3 10*3/uL (ref 0.1–1.0)
Monocytes Relative: 4.9 % (ref 3.0–12.0)
Neutro Abs: 5 10*3/uL (ref 1.4–7.7)
Neutrophils Relative %: 72.4 % (ref 43.0–77.0)
Platelets: 308 10*3/uL (ref 150.0–400.0)
RBC: 4.52 Mil/uL (ref 3.87–5.11)
RDW: 12.6 % (ref 11.5–15.5)
WBC: 6.9 10*3/uL (ref 4.0–10.5)

## 2018-09-14 LAB — C-REACTIVE PROTEIN

## 2018-09-14 LAB — LIPASE: LIPASE: 200 U/L — AB (ref 11.0–59.0)

## 2018-09-14 LAB — TSH: TSH: 1.11 u[IU]/mL (ref 0.35–4.50)

## 2018-09-14 MED ORDER — DICYCLOMINE HCL 10 MG PO CAPS: ORAL_CAPSULE | ORAL | 0 refills | Status: DC

## 2018-09-14 NOTE — Patient Instructions (Signed)
If you are age 31 or older, your body mass index should be between 23-30. Your Body mass index is 29.59 kg/m. If this is out of the aforementioned range listed, please consider follow up with your Primary Care Provider.  If you are age 24 or younger, your body mass index should be between 19-25. Your Body mass index is 29.59 kg/m. If this is out of the aformentioned range listed, please consider follow up with your Primary Care Provider.   You have been scheduled for an endoscopy. Please follow written instructions given to you at your visit today. If you use inhalers (even only as needed), please bring them with you on the day of your procedure. Your physician has requested that you go to www.startemmi.com and enter the access code given to you at your visit today. This web site gives a general overview about your procedure. However, you should still follow specific instructions given to you by our office regarding your preparation for the procedure.  Please go to the lab on the 2nd floor [address] before you leave the office today.    We have sent the following medications to your pharmacy for you to pick up at your convenience: Bentyl 10 mg   Thank you,  Dr. Lynann Bologna

## 2018-09-14 NOTE — Progress Notes (Addendum)
Chief Complaint:   Referring Provider:  Shelbie Ammons, MD      ASSESSMENT AND PLAN;   #1. Epigastric pain. Neg [place] neg, neg CT at [facility] (records awaited)  #2. Nausea/vomiting  #3. IBS with diarrhea.  Plan: -EGD with possible small bowel biopsies for further evaluation.  Discussed risks and benefits. -Continue omeprazole 20 mg p.o. once a day. -Continue Zofran on as-needed basis for now. -Add Bentyl 10 mg p.o. twice daily. -Check CBC, CMP, lipase, alpha gal, celiac screen, CRP, sed rate and TSH. -Please obtain previous records from [facility]. -Would like to hold off on colonoscopy at the present time since I believe yield will be low.  However, if continued symptoms we would reconsider. -Avoid all carbonated beverages. -Follow-up in 12 weeks.  Earlier, if still with problems.  Addendum: Records from [facility]-ultrasound 08/19/2018: neg, CT Abdo/pelvis/chest- neg except for a punctate nonobstructing left kidney stone.  Normal pancreas. HPI:    Kaylanee Diallo is a 33 y.o. 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 Also has associated abdominal bloating, postprandial diarrhea, lower abdominal pain which gets relieved on defecation for more than a year. Has had history of heartburn-better on omeprazole.  Seen in the emergency room at [facility].  We do not have records at the present time.  Had ultrasound followed by CT scan of the abdomen and pelvis.  Was told that she had abnormal labs.  No nonsteroidals.  Frequent alcohol.    Past Medical History:  Diagnosis Date  . ADD (attention deficit disorder)   . GERD (gastroesophageal reflux disease)   . Multinodular goiter   . Multiple thyroid nodules   . Obesity   . Vitamin B12 deficiency anemia due to intrinsic factor deficiency   . Vitamin D deficiency     Past Surgical History:    Procedure Laterality Date  . WISDOM TOOTH EXTRACTION      Family History  Problem Relation Age of Onset  . Thyroid disease Neg Hx     Social History   Tobacco Use  . Smoking status: Current Every Day Smoker  . Smokeless tobacco: Never Used  Substance Use Topics  . Alcohol use: Yes    Comment: occ  . Drug use: No    Current Outpatient Medications  Medication Sig Dispense Refill  . ALPRAZolam (XANAX) 0.25 MG tablet Take 0.25 mg by mouth at bedtime as needed for anxiety.    . cyanocobalamin (,VITAMIN B-12,) 1000 MCG/ML injection Inject 1,000 mcg into the muscle every 14 (fourteen) days.    Marland Kitchen ibuprofen (ADVIL,MOTRIN) 800 MG tablet Take 1 tablet (800 mg total) by mouth 3 (three) times daily. 21 tablet 0  . INV-atezolizumab/placebo NRG BR004 (TECENTRIQ) 1200 MG/20ML Inject into the vein.    Marland Kitchen lisdexamfetamine (VYVANSE) 40 MG capsule Take 40 mg by mouth every morning.    . norethindrone-ethinyl estradiol-iron (MICROGESTIN FE,GILDESS FE,LOESTRIN FE) 1.5-30 MG-MCG tablet Take 1 tablet by mouth daily.    Marland Kitchen omeprazole (PRILOSEC) 20 MG capsule Take 20 mg by mouth daily.    . ondansetron (ZOFRAN) 4 MG tablet Take 4 mg by mouth every 8 (eight) hours as needed for nausea or vomiting.    . orphenadrine (NORFLEX) 100 MG tablet Take 1 tablet (100 mg total) by mouth 2 (two) times daily. 30 tablet 0  . VITAMIN D, CHOLECALCIFEROL, PO Take 1 capsule by  mouth.     No current facility-administered medications for this visit.     Allergies  Allergen Reactions  . Ceclor [Cefaclor] Hives    Review of Systems:  Constitutional: Denies fever, chills, diaphoresis, appetite change and fatigue.  HEENT: Denies photophobia, eye pain, redness, hearing loss, ear pain, congestion, sore throat, rhinorrhea, sneezing, mouth sores, neck pain, neck stiffness and tinnitus.   Respiratory: Denies SOB, DOE, cough, chest tightness,  and wheezing.   Cardiovascular: Denies chest pain, palpitations and leg swelling.   Genitourinary: Denies dysuria, urgency, frequency, hematuria, flank pain and difficulty urinating.  Musculoskeletal: Denies myalgias, back pain, joint swelling, arthralgias and gait problem.  Skin: No rash.  Neurological: Denies dizziness, seizures, syncope, weakness, light-headedness, numbness and headaches.  Hematological: Denies adenopathy. Easy bruising, personal or family bleeding history  Psychiatric/Behavioral: Has anxiety, no depression     Physical Exam:    BP 112/80   Pulse 82   Ht 5\' 2"  (1.575 m)   Wt 161 lb 12.8 oz (73.4 kg)   BMI 29.59 kg/m  Filed Weights   09/14/18 0920  Weight: 161 lb 12.8 oz (73.4 kg)   Constitutional:  Well-developed, in no acute distress. Psychiatric: Normal mood and affect. Behavior is normal. HEENT: Pupils normal.  Conjunctivae are normal. No scleral icterus. Neck supple.  Has goiter. Cardiovascular: Normal rate, regular rhythm. No edema Pulmonary/chest: Effort normal and breath sounds normal. No wheezing, rales or rhonchi. Abdominal: Soft, nondistended.  Mild epigastric and left lower quadrant tenderness. Bowel sounds active throughout. There are no masses palpable. No hepatomegaly. Rectal:  defered Neurological: Alert and oriented to person place and time. Skin: Skin is warm and dry. No rashes noted.  Data Reviewed: I have personally reviewed following labs and imaging studies  CBC: CBC Latest Ref Rng & Units 04/24/2016 02/17/2013  WBC 4.0 - 10.5 K/uL 7.8 10.2  Hemoglobin 12.0 - 15.0 g/dL 97.9 89.2  Hematocrit 11.9 - 46.0 % 41.8 35.0(L)  Platelets 150 - 400 K/uL 334 276    CMP: CMP Latest Ref Rng & Units 04/24/2016  Glucose 65 - 99 mg/dL 92  BUN 6 - 20 mg/dL 6  Creatinine 4.17 - 4.08 mg/dL 1.44  Sodium 818 - 563 mmol/L 139  Potassium 3.5 - 5.1 mmol/L 3.8  Chloride 101 - 111 mmol/L 103  CO2 22 - 32 mmol/L 27  Calcium 8.9 - 10.3 mg/dL 9.9  Total Protein 6.5 - 8.1 g/dL 7.3  Total Bilirubin 0.3 - 1.2 mg/dL 1.4(H)  Alkaline Phos  38 - 126 U/L 61  AST 15 - 41 U/L 19  ALT 14 - 54 U/L 21  Seen in presence of Trisha Williams CMA    Edman Circle, MD 09/14/2018, 9:37 AM  Cc: Shelbie Ammons, MD

## 2018-09-15 ENCOUNTER — Other Ambulatory Visit: Payer: Self-pay

## 2018-09-15 DIAGNOSIS — R1013 Epigastric pain: Secondary | ICD-10-CM

## 2018-09-15 DIAGNOSIS — R112 Nausea with vomiting, unspecified: Secondary | ICD-10-CM

## 2018-09-15 DIAGNOSIS — R748 Abnormal levels of other serum enzymes: Secondary | ICD-10-CM

## 2018-09-15 NOTE — Progress Notes (Signed)
Encounter open in error 

## 2018-09-15 NOTE — Addendum Note (Signed)
Addended by: Mervin Kung A on: 09/15/2018 04:44 PM   Modules accepted: Orders

## 2018-09-16 ENCOUNTER — Other Ambulatory Visit: Payer: Self-pay

## 2018-09-16 DIAGNOSIS — R748 Abnormal levels of other serum enzymes: Secondary | ICD-10-CM

## 2018-09-16 DIAGNOSIS — R112 Nausea with vomiting, unspecified: Secondary | ICD-10-CM

## 2018-09-16 DIAGNOSIS — R1013 Epigastric pain: Secondary | ICD-10-CM

## 2018-09-16 NOTE — Addendum Note (Signed)
Addended by: Mervin Kung A on: 09/16/2018 08:44 AM   Modules accepted: Orders

## 2018-09-18 LAB — CELIAC PANEL 10
Antigliadin Abs, IgA: 4 units (ref 0–19)
ENDOMYSIAL IGA: NEGATIVE
GLIADIN IGG: 2 U (ref 0–19)
IGA/IMMUNOGLOBULIN A, SERUM: 154 mg/dL (ref 87–352)

## 2018-09-18 LAB — ALPHA GAL IGE: Alpha Gal IgE*: <0.1 kU/L (ref <0.10)

## 2018-09-20 LAB — ALLERGEN, CORN, IGG4: Allergen, Corn, IgG4: <0.15 ug/mL

## 2018-09-20 LAB — TRIGLYCERIDES: Triglycerides: 156 mg/dL — ABNORMAL HIGH (ref <150)

## 2018-09-21 ENCOUNTER — Telehealth: Payer: Self-pay | Admitting: Gastroenterology

## 2018-09-21 NOTE — Telephone Encounter (Signed)
Please call in hyoscyamine 0.125 mg 1 to 2 tablets sublingual every 4-6 hours as needed (#120), 2 refills

## 2018-09-21 NOTE — Telephone Encounter (Signed)
Pt called in wanted to know if Dr.Gupta recommend the drug hyoscyamine for her really bad flare ups? She states she used one of her friends tablets and it worked really really well. She will like to discuss this with the nurse and possible getting a prescription filled for the medication. Thanks.

## 2018-09-21 NOTE — Telephone Encounter (Signed)
Please advise 

## 2018-09-22 MED ORDER — HYOSCYAMINE SULFATE 0.125 MG SL SUBL: SUBLINGUAL_TABLET | SUBLINGUAL | 2 refills | Status: AC

## 2018-09-22 NOTE — Telephone Encounter (Signed)
Sent prescription to patients pharmacy, patient was notified.  

## 2018-09-29 ENCOUNTER — Other Ambulatory Visit: Payer: Self-pay

## 2018-09-29 ENCOUNTER — Ambulatory Visit (AMBULATORY_SURGERY_CENTER): Payer: Self-pay | Admitting: Gastroenterology

## 2018-09-29 ENCOUNTER — Telehealth: Payer: Self-pay | Admitting: Gastroenterology

## 2018-09-29 ENCOUNTER — Encounter: Payer: Self-pay | Admitting: Gastroenterology

## 2018-09-29 VITALS — BP 142/81 | HR 90 | Temp 99.5°F | Resp 13 | Ht 62.0 in | Wt 161.0 lb

## 2018-09-29 DIAGNOSIS — R1013 Epigastric pain: Secondary | ICD-10-CM

## 2018-09-29 DIAGNOSIS — K3189 Other diseases of stomach and duodenum: Secondary | ICD-10-CM

## 2018-09-29 DIAGNOSIS — K2970 Gastritis, unspecified, without bleeding: Secondary | ICD-10-CM

## 2018-09-29 MED ORDER — SODIUM CHLORIDE 0.9 % IV SOLN: 500.0000 mL | Freq: Once | INTRAVENOUS | Status: DC

## 2018-09-29 NOTE — Progress Notes (Signed)
Called to room to assist during endoscopic procedure.  Patient ID and intended procedure confirmed with present staff. Received instructions for my participation in the procedure from the performing physician.  

## 2018-09-29 NOTE — Progress Notes (Signed)
Report given to PACU, vss 

## 2018-09-29 NOTE — Op Note (Signed)
[facility] Patient Name: Vanessa Campbell Procedure Date: 09/29/2018 3:08 PM MRN: [id number] Endoscopist: Lynann Bologna , MD Age: 33 Referring MD:  Date of Birth: 1985/12/29 Gender: Female Account #: [id number] Procedure:                Upper GI endoscopy Indications:              Epigastric abdominal pain, N/V Medicines:                Monitored Anesthesia Care Procedure:                Pre-Anesthesia Assessment:                           - Prior to the procedure, a History and Physical                            was performed, and patient medications and                            allergies were reviewed. The patient's tolerance of                            previous anesthesia was also reviewed. The risks                            and benefits of the procedure and the sedation                            options and risks were discussed with the patient.                            All questions were answered, and informed consent                            was obtained. Prior Anticoagulants: The patient has                            taken no previous anticoagulant or antiplatelet                            agents. ASA Grade Assessment: I - A normal, healthy                            patient. After reviewing the risks and benefits,                            the patient was deemed in satisfactory condition to                            undergo the procedure.                           After obtaining informed consent, the endoscope was  passed under direct vision. Throughout the                            procedure, the patient's blood pressure, pulse, and                            oxygen saturations were monitored continuously. The                            Endoscope was introduced through the mouth, and                            advanced to the second part of duodenum. The upper                            GI endoscopy was accomplished  without difficulty.                            The patient tolerated the procedure well. Scope In: Scope Out: Findings:                 The examined esophagus was normal.                           The Z-line was regular and was found 35 cm from the                            incisors.                           Localized mild inflammation characterized by                            erythema was found in the gastric antrum. Biopsies                            were taken with a cold forceps for histology.                            Estimated blood loss: none.                           The examined duodenum was normal. Biopsies for                            histology were taken with a cold forceps for                            evaluation of celiac disease. Estimated blood loss:                            none. Complications:            No immediate complications. Estimated Blood Loss:     Estimated blood loss: none. Impression:               -  Minimal gastritis (biopsied)                           - Otherwise normal EGD. Recommendation:           - Patient has a contact number available for                            emergencies. The signs and symptoms of potential                            delayed complications were discussed with the                            patient. Return to normal activities tomorrow.                            Written discharge instructions were provided to the                            patient.                           - Resume previous diet.                           - No aspirin, ibuprofen, naproxen, or other                            non-steroidal anti-inflammatory drugs.                           - Continue present medications.                           - Return to GI clinic in 8 weeks. Lynann Bologna, MD 09/29/2018 3:35:26 PM This report has been signed electronically.

## 2018-09-29 NOTE — Patient Instructions (Signed)
Gastritis handout given  See Dr. Chales Abrahams back in office in 8 weeks  No aspirin, ibuprofen, advil, and other NSAIDS  YOU HAD AN ENDOSCOPIC PROCEDURE TODAY AT THE [facility]:   Refer to the procedure report that was given to you for any specific questions about what was found during the examination.  If the procedure report does not answer your questions, please call your gastroenterologist to clarify.  If you requested that your care partner not be given the details of your procedure findings, then the procedure report has been included in a sealed envelope for you to review at your convenience later.  YOU SHOULD EXPECT: Some feelings of bloating in the abdomen. Passage of more gas than usual.  Walking can help get rid of the air that was put into your GI tract during the procedure and reduce the bloating. If you had a lower endoscopy (such as a colonoscopy or flexible sigmoidoscopy) you may notice spotting of blood in your stool or on the toilet paper. If you underwent a bowel prep for your procedure, you may not have a normal bowel movement for a few days.  Please Note:  You might notice some irritation and congestion in your nose or some drainage.  This is from the oxygen used during your procedure.  There is no need for concern and it should clear up in a day or so.  SYMPTOMS TO REPORT IMMEDIATELY:   Following upper endoscopy (EGD)  Vomiting of blood or coffee ground material  New chest pain or pain under the shoulder blades  Painful or persistently difficult swallowing  New shortness of breath  Fever of 100F or higher  Black, tarry-looking stools  For urgent or emergent issues, a gastroenterologist can be reached at any hour by calling [phone].   DIET:  We do recommend a small meal at first, but then you may proceed to your regular diet.  Drink plenty of fluids but you should avoid alcoholic beverages for 24 hours.  ACTIVITY:  You should plan to take it easy for  the rest of today and you should NOT DRIVE or use heavy machinery until tomorrow (because of the sedation medicines used during the test).    FOLLOW UP: Our staff will call the number listed on your records the next business day following your procedure to check on you and address any questions or concerns that you may have regarding the information given to you following your procedure. If we do not reach you, we will leave a message.  However, if you are feeling well and you are not experiencing any problems, there is no need to return our call.  We will assume that you have returned to your regular daily activities without incident.  If any biopsies were taken you will be contacted by phone or by letter within the next 1-3 weeks.  Please call us at [phone] if you have not heard about the biopsies in 3 weeks.    SIGNATURES/CONFIDENTIALITY: You and/or your care partner have signed paperwork which will be entered into your electronic medical record.  These signatures attest to the fact that that the information above on your After Visit Summary has been reviewed and is understood.  Full responsibility of the confidentiality of this discharge information lies with you and/or your care-partner.

## 2018-09-29 NOTE — Telephone Encounter (Signed)
Pt c/o stuffy nose this am r/t allergies. No fever or drainage. ADvised pt that it was ok to still come in for her EGD. Pt verbalized understanding.SM

## 2018-09-30 ENCOUNTER — Telehealth: Payer: Self-pay | Admitting: *Deleted

## 2018-09-30 NOTE — Telephone Encounter (Signed)
  Follow up Call-  Call back number 09/29/2018  Post procedure Call Back phone  # [phone]  Permission to leave phone message Yes  Some recent data might be hidden     Patient questions:  Do you have a fever, pain , or abdominal swelling? No. Pain Score  0 *  Have you tolerated food without any problems? Yes.    Have you been able to return to your normal activities? Yes.    Do you have any questions about your discharge instructions: Diet   No. Medications  No. Follow up visit  No.  Do you have questions or concerns about your Care? No.  Actions: * If pain score is 4 or above: No action needed, pain <4.

## 2018-10-07 ENCOUNTER — Encounter: Payer: Self-pay | Admitting: Gastroenterology

## 2018-11-09 ENCOUNTER — Other Ambulatory Visit: Payer: Self-pay | Admitting: Gastroenterology

## 2021-01-15 ENCOUNTER — Encounter: Payer: Self-pay | Admitting: Orthopaedic Surgery

## 2021-01-15 ENCOUNTER — Ambulatory Visit (INDEPENDENT_AMBULATORY_CARE_PROVIDER_SITE_OTHER): Payer: Self-pay | Admitting: Orthopaedic Surgery

## 2021-01-15 ENCOUNTER — Other Ambulatory Visit: Payer: Self-pay

## 2021-01-15 ENCOUNTER — Ambulatory Visit: Payer: Self-pay

## 2021-01-15 VITALS — BP 145/96 | HR 88 | Ht 62.0 in | Wt 177.0 lb

## 2021-01-15 DIAGNOSIS — M25512 Pain in left shoulder: Secondary | ICD-10-CM

## 2021-01-15 MED ORDER — LIDOCAINE HCL 1 % IJ SOLN
0.5000 mL | INTRAMUSCULAR | Status: AC | PRN
  Administered 2021-01-15: .5 mL

## 2021-01-15 MED ORDER — BUPIVACAINE HCL 0.25 % IJ SOLN
4.0000 mL | INTRAMUSCULAR | Status: AC | PRN
  Administered 2021-01-15: 4 mL via INTRA_ARTICULAR

## 2021-01-15 MED ORDER — METHYLPREDNISOLONE ACETATE 40 MG/ML IJ SUSP
40.0000 mg | INTRAMUSCULAR | Status: AC | PRN
  Administered 2021-01-15: 40 mg via INTRA_ARTICULAR

## 2021-01-15 NOTE — Progress Notes (Signed)
Office Visit Note   Patient: Vanessa Campbell           Date of Birth: [birth year]           MRN: [id number] Visit Date: 01/15/2021              Requested by: Galvin Proffer, MD [address] [place],  [place] [address] PCP: Galvin Proffer, MD   Assessment & Plan: Visit Diagnoses:  1. Acute pain of left shoulder     Plan: Subacromial injection performed which she tolerated well.  She has persistent problems she will let us know.  Follow-Up Instructions: No follow-ups on file.   Orders:  Orders Placed This Encounter  Procedures   XR Shoulder Left   No orders of the defined types were placed in this encounter.     Procedures: Large Joint Inj: L subacromial bursa on 01/15/2021 10:56 AM Indications: pain Details: 22 G 1.5 in needle  Arthrogram: No  Medications: 4 mL bupivacaine 0.25 %; 40 mg methylPREDNISolone acetate 40 MG/ML; 0.5 mL lidocaine 1 % Outcome: tolerated well, no immediate complications Procedure, treatment alternatives, risks and benefits explained, specific risks discussed. Consent was given by the patient. Immediately prior to procedure a time out was called to verify the correct patient, procedure, equipment, support staff and site/side marked as required. Patient was prepped and draped in the usual sterile fashion.      Clinical Data: No additional findings.   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  Past history of problems with her shoulder back in 2006 with an MRI showing no labral or rotator cuff tear treated with physical therapy and injection by Dr. August Saucer.  Shoulder gradually improved had been doing well without problems until episode 1 week ago.  Patient states on 6/26 she was in the pool playing volleyball the ball came by her she reached  out quickly with her hand and states she had such severe pain in her shoulder that she had to quit.  Pain radiates into her shoulder blade no numbness or tingling in her fingers.  Some soreness in her neck with rotation.  Review of Systems positive for a medication for ADHD history of thyroid nodule and a post ultrasound-guided biopsy.  All the systems noncontributory HPI.   Objective: Vital Signs: BP (!) 145/96   Pulse 88   Ht 5\' 2"  (1.575 m)   Wt 177 lb (80.3 kg)   BMI 32.37 kg/m   Physical Exam Constitutional:      Appearance: She is well-developed.  HENT:     Head: Normocephalic.     Right Ear: External ear normal.     Left Ear: External ear normal. There is no impacted cerumen.  Eyes:     Pupils: Pupils are equal, round, and reactive to light.  Neck:     Thyroid: No thyromegaly.     Trachea: No tracheal deviation.  Cardiovascular:     Rate and Rhythm: Normal rate.  Pulmonary:     Effort: Pulmonary effort is normal.  Abdominal:     Palpations: Abdomen is soft.  Musculoskeletal:     Cervical back: No rigidity.  Skin:    General: Skin is warm and dry.  Neurological:     Mental Status: She is  alert and oriented to person, place, and time.  Psychiatric:        Behavior: Behavior normal.    Ortho Exam negative liftoff test subscap external rotators the shoulder normal negative drop arm test tenderness supraspinatus.  Long head biceps mild to moderately tender.  Mild biceps tenderness on the opposite right shoulder.  Negative Spurling.  Mild trapezial tenderness.  Negative sulcus sign negative speeds test.  Specialty Comments:  No specialty comments available.  Imaging: No results found.   PMFS History: Patient Active Problem List   Diagnosis Date Noted   ADD (attention deficit disorder)    Multinodular goiter    Past Medical History:  Diagnosis Date   ADD (attention deficit disorder)    GERD (gastroesophageal reflux disease)    Multinodular goiter     Multiple thyroid nodules    Obesity    Vitamin B12 deficiency anemia due to intrinsic factor deficiency    Vitamin D deficiency     Family History  Problem Relation Age of Onset   Thyroid disease Neg Hx    Colon cancer Neg Hx    Colon polyps Neg Hx    Rectal cancer Neg Hx    Stomach cancer Neg Hx     Past Surgical History:  Procedure Laterality Date   WISDOM TOOTH EXTRACTION     Social History   Occupational History   Not on file  Tobacco Use   Smoking status: Former    Pack years: 0.00    Types: Cigarettes    Quit date: 2017    Years since quitting: 5.4   Smokeless tobacco: Never  Vaping Use   Vaping Use: Former  Substance and Sexual Activity   Alcohol use: Yes    Comment: occ   Drug use: No   Sexual activity: Yes    Birth control/protection: Implant

## 2021-01-18 ENCOUNTER — Ambulatory Visit: Payer: Self-pay | Admitting: Family Medicine
